# Patient Record
Sex: Female | Born: 1955 | Race: White | Hispanic: No | Marital: Married | Smoking: Current every day smoker
Health system: Southern US, Community
[De-identification: ages and names within clinical notes are randomized; demographics above are authoritative.]

## PROBLEM LIST (undated history)

## (undated) DIAGNOSIS — B379 Candidiasis, unspecified: Secondary | ICD-10-CM

## (undated) DIAGNOSIS — N83209 Unspecified ovarian cyst, unspecified side: Secondary | ICD-10-CM

## (undated) DIAGNOSIS — D649 Anemia, unspecified: Secondary | ICD-10-CM

## (undated) DIAGNOSIS — E079 Disorder of thyroid, unspecified: Secondary | ICD-10-CM

## (undated) HISTORY — PX: TONSILLECTOMY: SUR1361

## (undated) HISTORY — DX: Unspecified ovarian cyst, unspecified side: N83.209

## (undated) HISTORY — PX: WISDOM TOOTH EXTRACTION: SHX21

## (undated) HISTORY — DX: Candidiasis, unspecified: B37.9

## (undated) HISTORY — PX: ABDOMINAL HYSTERECTOMY: SUR658

## (undated) HISTORY — PX: BREAST EXCISIONAL BIOPSY: SUR124

## (undated) HISTORY — DX: Anemia, unspecified: D64.9

## (undated) HISTORY — PX: LAPAROSCOPY: SHX197

## (undated) HISTORY — DX: Disorder of thyroid, unspecified: E07.9

## (undated) HISTORY — PX: BILATERAL OOPHORECTOMY: SHX1221

---

## 1999-01-14 ENCOUNTER — Other Ambulatory Visit: Admission: RE | Admit: 1999-01-14 | Discharge: 1999-01-14 | Payer: Self-pay | Admitting: *Deleted

## 1999-07-08 ENCOUNTER — Other Ambulatory Visit: Admission: RE | Admit: 1999-07-08 | Discharge: 1999-07-08 | Payer: Self-pay | Admitting: Obstetrics and Gynecology

## 1999-07-21 ENCOUNTER — Other Ambulatory Visit: Admission: RE | Admit: 1999-07-21 | Discharge: 1999-07-21 | Payer: Self-pay | Admitting: Obstetrics and Gynecology

## 1999-07-24 ENCOUNTER — Encounter: Payer: Self-pay | Admitting: Obstetrics and Gynecology

## 1999-07-30 ENCOUNTER — Inpatient Hospital Stay (HOSPITAL_COMMUNITY): Admission: RE | Admit: 1999-07-30 | Discharge: 1999-08-03 | Payer: Self-pay | Admitting: Obstetrics and Gynecology

## 2000-09-21 ENCOUNTER — Other Ambulatory Visit: Admission: RE | Admit: 2000-09-21 | Discharge: 2000-09-21 | Payer: Self-pay | Admitting: Obstetrics and Gynecology

## 2001-10-19 ENCOUNTER — Other Ambulatory Visit: Admission: RE | Admit: 2001-10-19 | Discharge: 2001-10-19 | Payer: Self-pay | Admitting: Obstetrics and Gynecology

## 2003-01-03 ENCOUNTER — Other Ambulatory Visit: Admission: RE | Admit: 2003-01-03 | Discharge: 2003-01-03 | Payer: Self-pay | Admitting: Obstetrics and Gynecology

## 2004-04-21 ENCOUNTER — Emergency Department (HOSPITAL_COMMUNITY): Admission: EM | Admit: 2004-04-21 | Discharge: 2004-04-21 | Payer: Self-pay | Admitting: Emergency Medicine

## 2004-06-04 ENCOUNTER — Other Ambulatory Visit: Admission: RE | Admit: 2004-06-04 | Discharge: 2004-06-04 | Payer: Self-pay | Admitting: Obstetrics and Gynecology

## 2005-06-09 ENCOUNTER — Other Ambulatory Visit: Admission: RE | Admit: 2005-06-09 | Discharge: 2005-06-09 | Payer: Self-pay | Admitting: Obstetrics and Gynecology

## 2012-02-03 ENCOUNTER — Encounter: Payer: Self-pay | Admitting: Obstetrics and Gynecology

## 2012-02-03 ENCOUNTER — Ambulatory Visit (INDEPENDENT_AMBULATORY_CARE_PROVIDER_SITE_OTHER): Payer: Commercial Indemnity

## 2012-02-03 ENCOUNTER — Ambulatory Visit (INDEPENDENT_AMBULATORY_CARE_PROVIDER_SITE_OTHER): Payer: Commercial Indemnity | Admitting: Obstetrics and Gynecology

## 2012-02-03 VITALS — BP 110/70 | HR 78 | Resp 16 | Ht 66.5 in | Wt 184.0 lb

## 2012-02-03 DIAGNOSIS — E785 Hyperlipidemia, unspecified: Secondary | ICD-10-CM | POA: Insufficient documentation

## 2012-02-03 DIAGNOSIS — Z780 Asymptomatic menopausal state: Secondary | ICD-10-CM

## 2012-02-03 DIAGNOSIS — Z139 Encounter for screening, unspecified: Secondary | ICD-10-CM

## 2012-02-03 DIAGNOSIS — E079 Disorder of thyroid, unspecified: Secondary | ICD-10-CM | POA: Insufficient documentation

## 2012-02-03 DIAGNOSIS — Z01419 Encounter for gynecological examination (general) (routine) without abnormal findings: Secondary | ICD-10-CM

## 2012-02-03 DIAGNOSIS — N83209 Unspecified ovarian cyst, unspecified side: Secondary | ICD-10-CM | POA: Insufficient documentation

## 2012-02-03 NOTE — Progress Notes (Signed)
The patient is not currently taking hormone replacement therapy The patient  is not taking a Calcium supplement. Post-menopausal bleeding:no  Last Pap: was normal   2010 per pt  Last mammogram: was normal   2010 Last DEXA scan : Pt is Unsure when last dexa scan was done.  Last colonoscopy:Pt has never had colonoscopy    Urinary symptoms: none Normal bowel movements: Yes Reports abuse at home: No:   Subjective:    Kathy Allison is a 56 y.o. female G1P0 who presents for annual exam. S/p TAH / BSO and bowel resection for satge 4 endometriosis The patient has no complaints today.   The following portions of the patient's history were reviewed and updated as appropriate: allergies, current medications, past family history, past medical history, past social history, past surgical history and problem list.  Review of Systems Pertinent items are noted in HPI. Gastrointestinal:No change in bowel habits, no abdominal pain, no rectal bleeding Genitourinary:negative for dysuria, frequency, hematuria, nocturia and urinary incontinence    Objective:     BP 110/70  Pulse 78  Resp 16  Ht 5' 6.5" (1.689 m)  Wt 184 lb (83.462 kg)  BMI 29.25 kg/m2  Weight:  Wt Readings from Last 1 Encounters:  02/03/12 184 lb (83.462 kg)     BMI: Body mass index is 29.25 kg/(m^2). General Appearance: Alert, appropriate appearance for age. No acute distress HEENT: Grossly normal Neck / Thyroid: Supple, no masses, nodes or enlargement Lungs: clear to auscultation bilaterally Back: No CVA tenderness Breast Exam: No masses or nodes.No dimpling, nipple retraction or discharge. Cardiovascular: Regular rate and rhythm. S1, S2, no murmur Gastrointestinal: Soft, non-tender, no masses or organomegaly Pelvic Exam: Vulva and vagina appear normal. Bimanual exam  normal . Uterus and ovaries surgically absent Rectovaginal: normal rectal, no masses Lymphatic Exam: Non-palpable nodes in neck, clavicular, axillary, or  inguinal regions Skin: no rash or abnormalities Neurologic: Normal gait and speech, no tremor  Psychiatric: Alert and oriented, appropriate affect.     Assessment:    Normal gyn exam    Plan:   mammogram return annually or prn CRC screen with Dr. Loreta Ave Dexa scan today [facility] @ [facility] to be scheduled  Silverio Lay MD

## 2013-07-06 ENCOUNTER — Ambulatory Visit (INDEPENDENT_AMBULATORY_CARE_PROVIDER_SITE_OTHER): Payer: Managed Care, Other (non HMO) | Admitting: General Surgery

## 2013-07-06 ENCOUNTER — Encounter (INDEPENDENT_AMBULATORY_CARE_PROVIDER_SITE_OTHER): Payer: Self-pay | Admitting: General Surgery

## 2013-07-06 VITALS — BP 111/83 | HR 70 | Temp 97.6°F | Resp 16 | Ht 66.5 in | Wt 181.0 lb

## 2013-07-06 DIAGNOSIS — L03319 Cellulitis of trunk, unspecified: Secondary | ICD-10-CM

## 2013-07-06 DIAGNOSIS — L02219 Cutaneous abscess of trunk, unspecified: Secondary | ICD-10-CM

## 2013-07-06 DIAGNOSIS — L02213 Cutaneous abscess of chest wall: Secondary | ICD-10-CM

## 2013-07-06 MED ORDER — SULFAMETHOXAZOLE-TRIMETHOPRIM 400-80 MG PO TABS: 1.0000 | ORAL_TABLET | Freq: Two times a day (BID) | ORAL | Status: DC

## 2013-07-06 MED ORDER — OXYCODONE-ACETAMINOPHEN 5-325 MG PO TABS: 1.0000 | ORAL_TABLET | Freq: Four times a day (QID) | ORAL | Status: AC | PRN

## 2013-07-06 NOTE — Progress Notes (Signed)
Patient ID: Kathy Allison, female   DOB: December 17, 1955, 58 y.o.   MRN: [id number]  Chief Complaint  Patient presents with  . Wound Check    HPI Kathy Allison is a 58 y.o. female.  Pt referred by Dr. Estanislado Pandy for breast abscess.  Pt states that she had a "bug bite" that got worse and redder.  She has this incised by Dr. Estanislado Pandy and started on abx.   She states that pus has continued to come from the wound.   HPI  Past Medical History  Diagnosis Date  . Thyroid disease   . Anemia   . Yeast infection   . Ovarian cyst     Past Surgical History  Procedure Laterality Date  . Wisdom tooth extraction    . Tonsillectomy    . Laparoscopy    . Abdominal hysterectomy      2001 stage 4 endometriosis requiring bowel resection  . Bilateral oophorectomy      Family History  Problem Relation Age of Onset  . Diabetes Paternal Grandfather   . Heart disease Paternal Grandmother     had a Visual merchandiser  . Emphysema Maternal Grandfather   . Breast cancer Sister   . Diabetes Paternal Aunt     Social History History  Substance Use Topics  . Smoking status: Current Every Day Smoker -- 10.00 packs/day    Types: Cigarettes  . Smokeless tobacco: Never Used  . Alcohol Use: Yes     Comment: Wine Socially     No Known Allergies  Current Outpatient Prescriptions  Medication Sig Dispense Refill  . cephALEXin (KEFLEX) 500 MG capsule       . ezetimibe-simvastatin (VYTORIN) 10-20 MG per tablet Take 1 tablet by mouth at bedtime.      Marland Kitchen levothyroxine (SYNTHROID) 75 MCG tablet Take 75 mcg by mouth daily.      . pravastatin (PRAVACHOL) 80 MG tablet        No current facility-administered medications for this visit.    Review of Systems Review of Systems  Constitutional: Negative.   HENT: Negative.   Respiratory: Negative.   Cardiovascular: Negative.   Gastrointestinal: Negative.   Neurological: Negative.   All other systems reviewed and are negative.   Blood pressure 111/83, pulse 70,  temperature 97.6 F (36.4 C), temperature source Temporal, resp. rate 16, height 5' 6.5" (1.689 m), weight 181 lb (82.101 kg).  Physical Exam Physical Exam  Constitutional: She is oriented to person, place, and time. She appears well-developed and well-nourished.  HENT:  Head: Normocephalic and atraumatic.  Eyes: Conjunctivae and EOM are normal. Pupils are equal, round, and reactive to light.  Neck: Normal range of motion. Neck supple.  Cardiovascular: Normal rate, regular rhythm and normal heart sounds.   Pulmonary/Chest: Effort normal and breath sounds normal.    Pus coming from chest wall wound.  Musculoskeletal: Normal range of motion.  Neurological: She is alert and oriented to person, place, and time.  Skin: Skin is warm and dry.  Psychiatric: She has a normal mood and affect.    Data Reviewed none  Assessment    58 y/o F with L breast abscess.     Plan    1. I&D of L breast abscess with packing 2. DC cephalexan, start bactrim 3. R/u in 2 weeks 4. BID dressing changes.        Axel Filler 07/06/2013, 3:21 PM

## 2013-07-16 ENCOUNTER — Ambulatory Visit (INDEPENDENT_AMBULATORY_CARE_PROVIDER_SITE_OTHER): Payer: Managed Care, Other (non HMO) | Admitting: General Surgery

## 2013-07-16 ENCOUNTER — Encounter (INDEPENDENT_AMBULATORY_CARE_PROVIDER_SITE_OTHER): Payer: Managed Care, Other (non HMO) | Admitting: General Surgery

## 2013-07-16 ENCOUNTER — Encounter (INDEPENDENT_AMBULATORY_CARE_PROVIDER_SITE_OTHER): Payer: Self-pay | Admitting: General Surgery

## 2013-07-16 VITALS — BP 108/70 | HR 66 | Temp 98.1°F | Resp 16 | Ht 66.5 in | Wt 180.0 lb

## 2013-07-16 DIAGNOSIS — L02213 Cutaneous abscess of chest wall: Secondary | ICD-10-CM

## 2013-07-16 DIAGNOSIS — L03319 Cellulitis of trunk, unspecified: Secondary | ICD-10-CM

## 2013-07-16 DIAGNOSIS — L02219 Cutaneous abscess of trunk, unspecified: Secondary | ICD-10-CM

## 2013-07-16 NOTE — Progress Notes (Signed)
Subjective:     Patient ID: Kathy Allison, female   DOB: 1955-06-12, 58 y.o.   MRN: [id number]  HPI Patient has been doing well since her last visit. She's continued packing her wound. She sufficient antibiotics today. She's had no further drainage.  Review of Systems  Constitutional: Negative.   HENT: Negative.   Respiratory: Negative.   Cardiovascular: Negative.   Gastrointestinal: Negative.   Neurological: Negative.   All other systems reviewed and are negative.      Objective:   Physical Exam  Constitutional: She is oriented to person, place, and time. She appears well-developed and well-nourished.  HENT:  Head: Normocephalic and atraumatic.  Eyes: Conjunctivae and EOM are normal. Pupils are equal, round, and reactive to light.  Neck: Normal range of motion. Neck supple.  Cardiovascular: Normal rate, regular rhythm and normal heart sounds.   Pulmonary/Chest: Effort normal and breath sounds normal.    Healed incision, no erythema, no drainage  Musculoskeletal: Normal range of motion.  Neurological: She is alert and oriented to person, place, and time.  Skin: Skin is warm and dry.  Psychiatric: She has a normal mood and affect.       Assessment:     58 year old female status post cellulitis and abscess status post I&D     Plan:     1. Patient can follow up as needed 2. Patient can finished her antibiotics today

## 2013-08-13 ENCOUNTER — Encounter (INDEPENDENT_AMBULATORY_CARE_PROVIDER_SITE_OTHER): Payer: Managed Care, Other (non HMO) | Admitting: General Surgery

## 2013-12-17 ENCOUNTER — Encounter (INDEPENDENT_AMBULATORY_CARE_PROVIDER_SITE_OTHER): Payer: Self-pay | Admitting: General Surgery

## 2017-10-05 ENCOUNTER — Other Ambulatory Visit: Payer: Self-pay | Admitting: Family Medicine

## 2017-10-05 DIAGNOSIS — Z720 Tobacco use: Secondary | ICD-10-CM

## 2017-10-12 ENCOUNTER — Ambulatory Visit: Payer: Self-pay

## 2017-10-25 ENCOUNTER — Ambulatory Visit
Admission: RE | Admit: 2017-10-25 | Discharge: 2017-10-25 | Disposition: A | Discharge status: Home / Self Care | Payer: BLUE CROSS/BLUE SHIELD | Source: Ambulatory Visit | Attending: Family Medicine | Admitting: Family Medicine

## 2017-10-25 DIAGNOSIS — Z720 Tobacco use: Secondary | ICD-10-CM

## 2019-11-29 ENCOUNTER — Other Ambulatory Visit: Payer: Self-pay | Admitting: Family Medicine

## 2019-11-29 DIAGNOSIS — Z720 Tobacco use: Secondary | ICD-10-CM

## 2019-12-05 ENCOUNTER — Ambulatory Visit: Payer: BLUE CROSS/BLUE SHIELD

## 2019-12-20 ENCOUNTER — Ambulatory Visit
Admission: RE | Admit: 2019-12-20 | Discharge: 2019-12-20 | Disposition: A | Discharge status: Home / Self Care | Payer: BLUE CROSS/BLUE SHIELD | Source: Ambulatory Visit | Attending: Family Medicine | Admitting: Family Medicine

## 2019-12-20 DIAGNOSIS — Z720 Tobacco use: Secondary | ICD-10-CM

## 2020-10-24 ENCOUNTER — Other Ambulatory Visit: Payer: Self-pay | Admitting: Family Medicine

## 2020-10-24 DIAGNOSIS — R911 Solitary pulmonary nodule: Secondary | ICD-10-CM

## 2020-10-25 ENCOUNTER — Inpatient Hospital Stay: Admission: RE | Admit: 2020-10-25 | Payer: BC Managed Care – PPO | Source: Ambulatory Visit

## 2020-10-29 ENCOUNTER — Other Ambulatory Visit: Payer: Self-pay | Admitting: Family Medicine

## 2020-10-29 DIAGNOSIS — R911 Solitary pulmonary nodule: Secondary | ICD-10-CM

## 2020-10-29 DIAGNOSIS — Z720 Tobacco use: Secondary | ICD-10-CM

## 2020-11-14 ENCOUNTER — Ambulatory Visit: Payer: BC Managed Care – PPO

## 2020-11-19 ENCOUNTER — Ambulatory Visit
Admission: RE | Admit: 2020-11-19 | Discharge: 2020-11-19 | Disposition: A | Discharge status: Home / Self Care | Payer: BC Managed Care – PPO | Source: Ambulatory Visit | Attending: Family Medicine | Admitting: Family Medicine

## 2020-11-19 DIAGNOSIS — Z720 Tobacco use: Secondary | ICD-10-CM

## 2020-11-19 DIAGNOSIS — R911 Solitary pulmonary nodule: Secondary | ICD-10-CM

## 2021-11-12 ENCOUNTER — Other Ambulatory Visit: Payer: Self-pay | Admitting: Family Medicine

## 2021-11-12 DIAGNOSIS — R911 Solitary pulmonary nodule: Secondary | ICD-10-CM

## 2021-12-09 ENCOUNTER — Ambulatory Visit
Admission: RE | Admit: 2021-12-09 | Discharge: 2021-12-09 | Disposition: A | Discharge status: Home / Self Care | Payer: BC Managed Care – PPO | Source: Ambulatory Visit | Attending: Family Medicine | Admitting: Family Medicine

## 2021-12-09 DIAGNOSIS — R911 Solitary pulmonary nodule: Secondary | ICD-10-CM

## 2022-03-17 ENCOUNTER — Other Ambulatory Visit: Payer: Self-pay | Admitting: Obstetrics and Gynecology

## 2022-03-17 DIAGNOSIS — Z13820 Encounter for screening for osteoporosis: Secondary | ICD-10-CM

## 2022-03-18 ENCOUNTER — Other Ambulatory Visit: Payer: Self-pay | Admitting: Obstetrics and Gynecology

## 2022-03-18 DIAGNOSIS — R928 Other abnormal and inconclusive findings on diagnostic imaging of breast: Secondary | ICD-10-CM

## 2022-03-29 ENCOUNTER — Ambulatory Visit
Admission: RE | Admit: 2022-03-29 | Discharge: 2022-03-29 | Disposition: A | Discharge status: Home / Self Care | Payer: BC Managed Care – PPO | Source: Ambulatory Visit | Attending: Obstetrics and Gynecology | Admitting: Obstetrics and Gynecology

## 2022-03-29 ENCOUNTER — Other Ambulatory Visit: Payer: Self-pay | Admitting: Obstetrics and Gynecology

## 2022-03-29 DIAGNOSIS — R928 Other abnormal and inconclusive findings on diagnostic imaging of breast: Secondary | ICD-10-CM

## 2022-03-29 DIAGNOSIS — N6489 Other specified disorders of breast: Secondary | ICD-10-CM

## 2022-04-09 ENCOUNTER — Ambulatory Visit
Admission: RE | Admit: 2022-04-09 | Discharge: 2022-04-09 | Disposition: A | Discharge status: Home / Self Care | Payer: BC Managed Care – PPO | Source: Ambulatory Visit | Attending: Obstetrics and Gynecology | Admitting: Obstetrics and Gynecology

## 2022-04-09 DIAGNOSIS — R928 Other abnormal and inconclusive findings on diagnostic imaging of breast: Secondary | ICD-10-CM

## 2022-04-09 DIAGNOSIS — N6489 Other specified disorders of breast: Secondary | ICD-10-CM

## 2022-04-09 HISTORY — PX: BREAST BIOPSY: SHX20

## 2022-04-14 ENCOUNTER — Ambulatory Visit
Admission: RE | Admit: 2022-04-14 | Discharge: 2022-04-14 | Disposition: A | Discharge status: Home / Self Care | Payer: BC Managed Care – PPO | Source: Ambulatory Visit | Attending: Obstetrics and Gynecology | Admitting: Obstetrics and Gynecology

## 2022-04-14 DIAGNOSIS — Z13820 Encounter for screening for osteoporosis: Secondary | ICD-10-CM

## 2022-08-18 IMAGING — CT CT CHEST LUNG CANCER SCREENING LOW DOSE W/O CM
2 of 5 series · 15 of 40 positions shown, 18 images · non-contrast
Comparison: CT chest lung cancer screening dated December 20, 2019
COMPARISON: CT chest lung cancer screening dated December 20, 2019

Addendum:
CLINICAL DATA: Current smoker with 53 pack-year history

EXAM:
CT CHEST WITHOUT CONTRAST LOW-DOSE FOR LUNG CANCER SCREENING
TECHNIQUE: Multidetector CT imaging of the chest was performed following the
standard protocol without IV contrast.

[Series 4: lung 1.00 br44 cor · coronal · 0.60mm/px · 3 of 306 slices shown]
[im 62/306  lung]
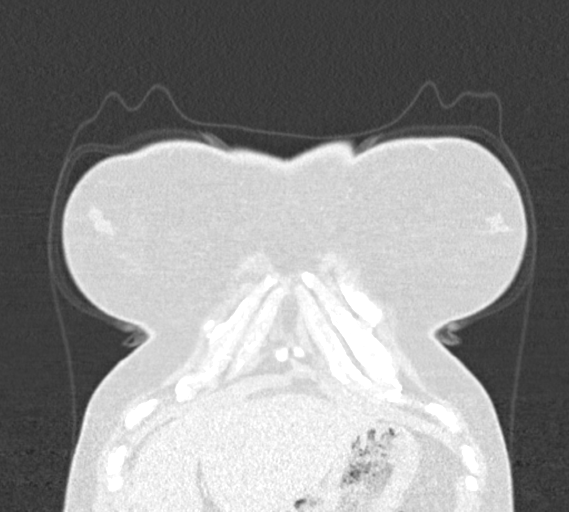
[im 123/306  lung]
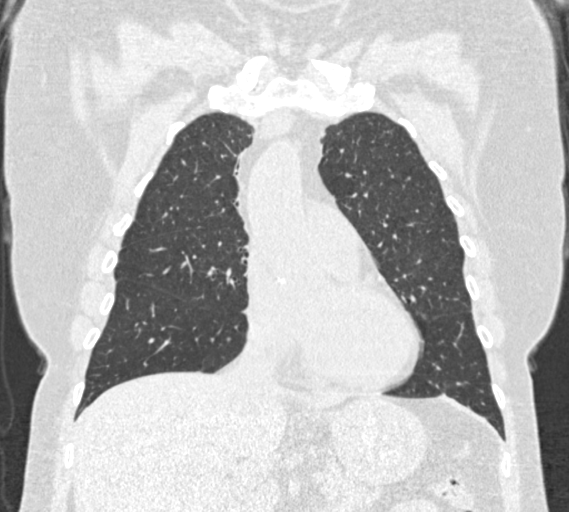
[im 184/306  lung]
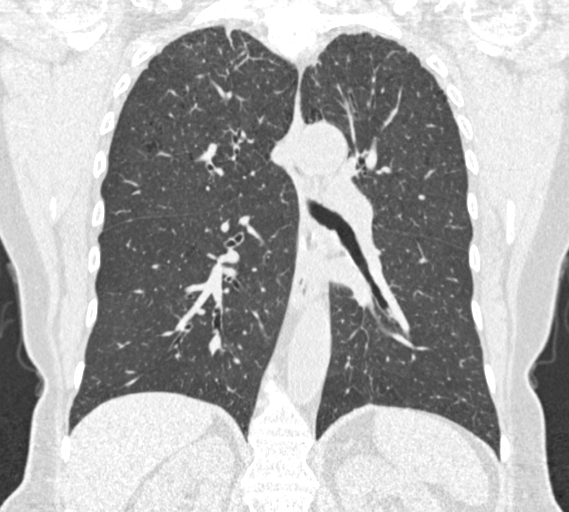

[Series 9: lung 1.00 br60 axial · axial · 0.67mm/px · z∈[-1267,-987]mm · 12 of 308 slices shown, 15 images]
[im 14/308  mediastinal]
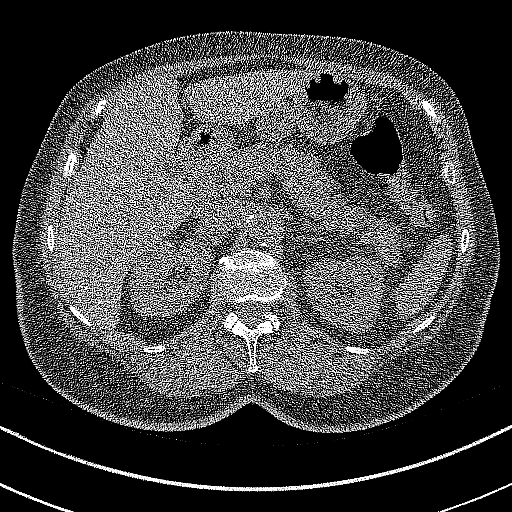
[im 14/308  lung]
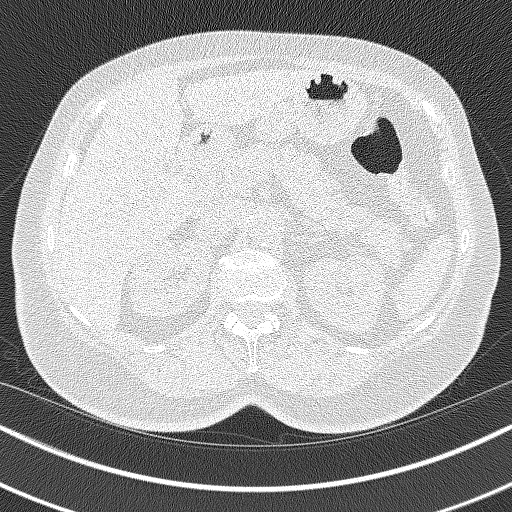
[im 42/308  lung]
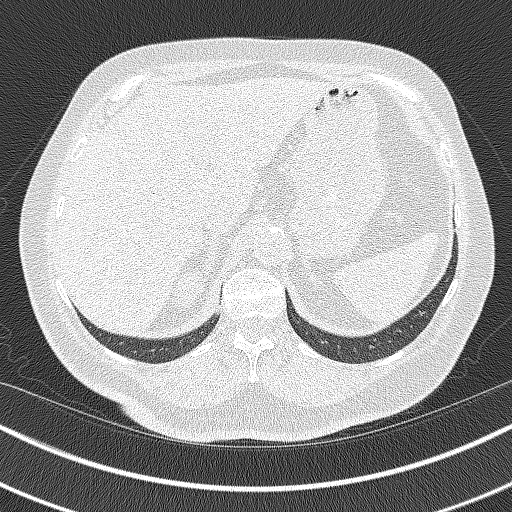
[im 70/308  lung]
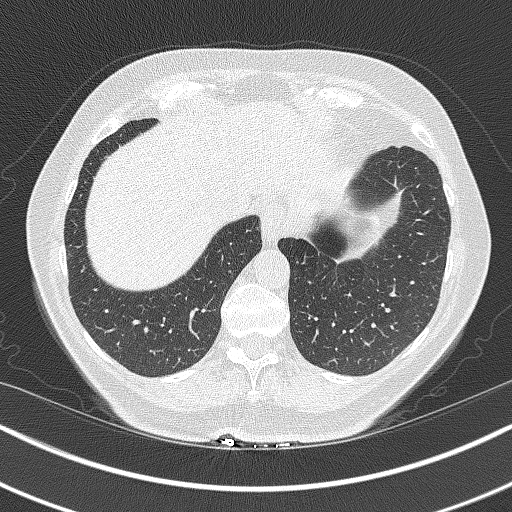
[im 98/308  lung]
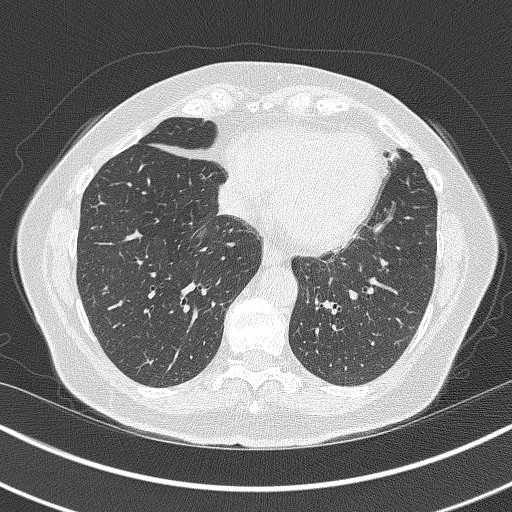
[im 112/308  mediastinal]
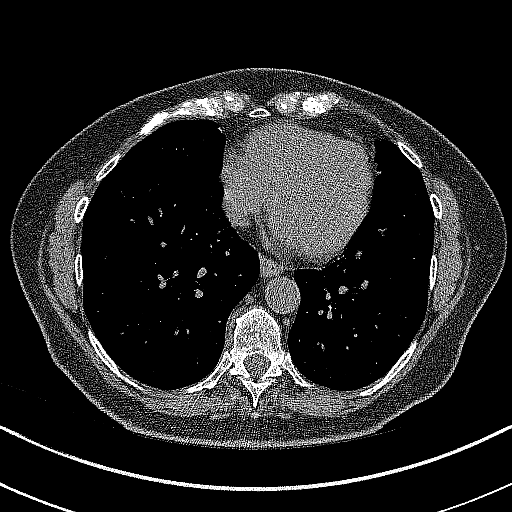
[im 112/308  lung]
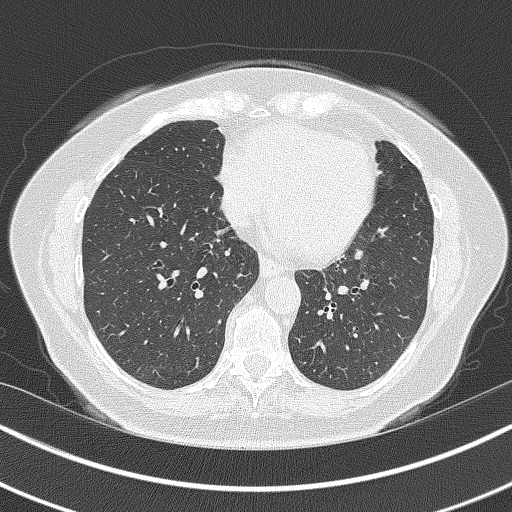
[im 140/308  lung]
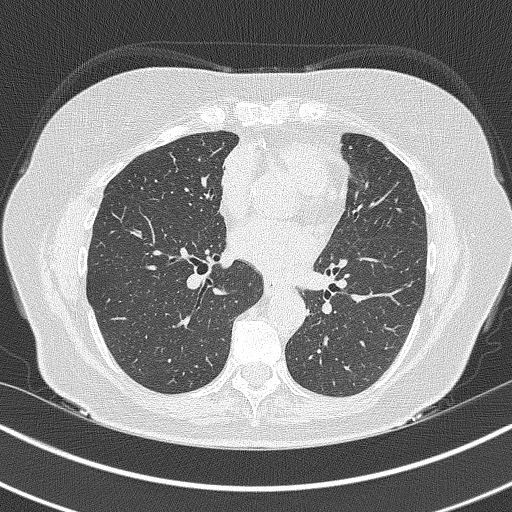
[im 168/308  lung]
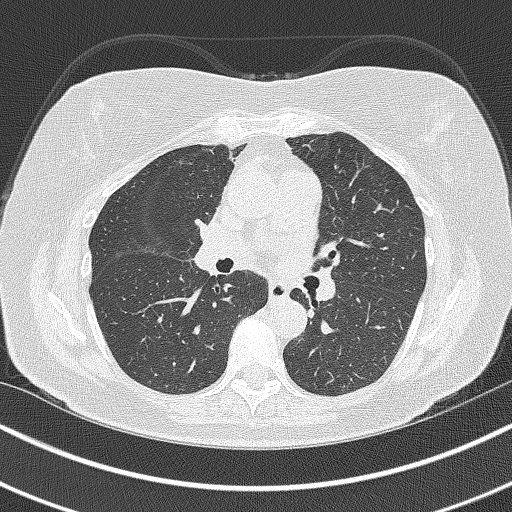
[im 196/308  lung]
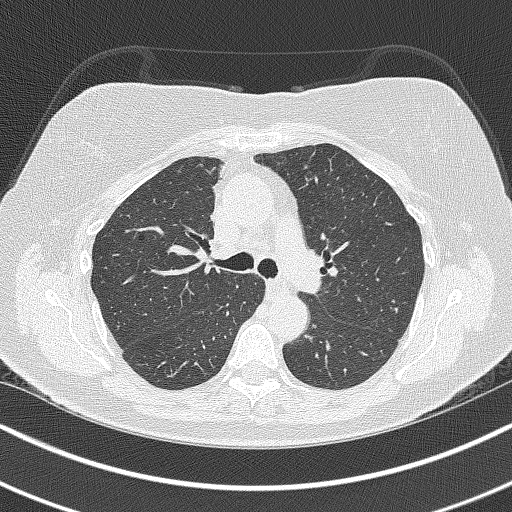
[im 210/308  mediastinal]
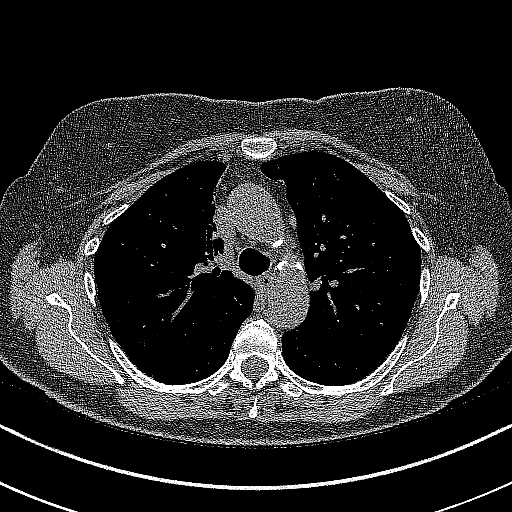
[im 210/308  lung]
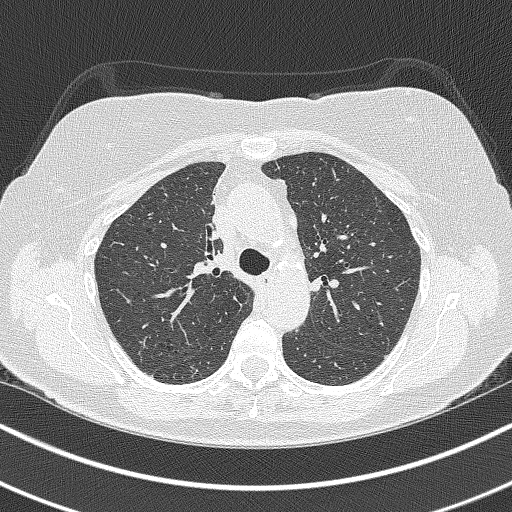
[im 238/308  lung]
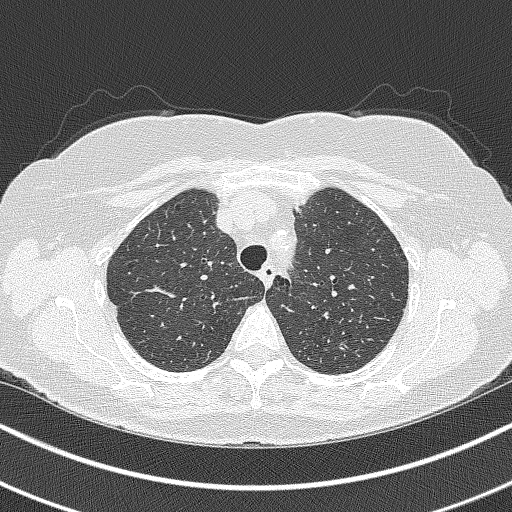
[im 266/308  lung]
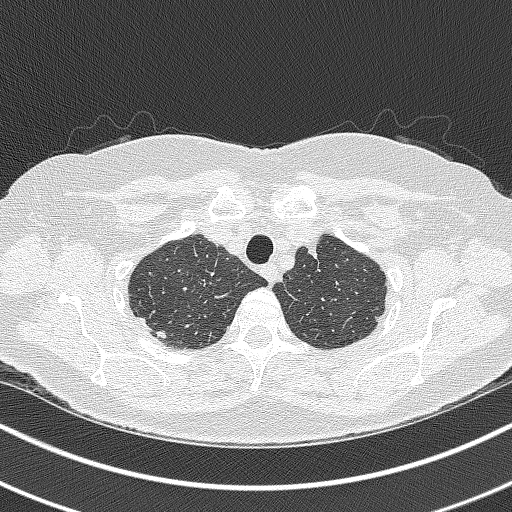
[im 294/308  lung]
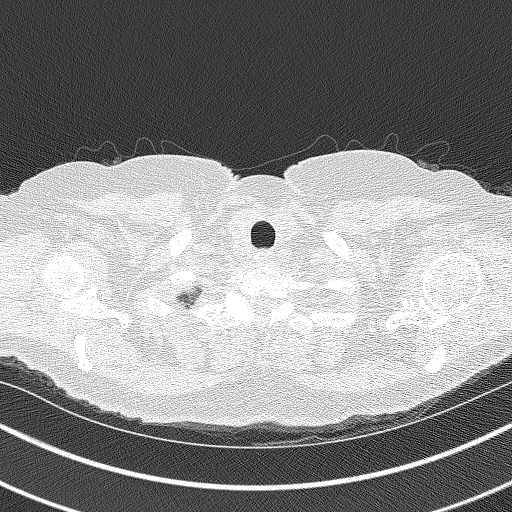

[15 of 40 positions shown; findings below may reference images not displayed]

FINDINGS: Cardiovascular: Normal heart size. Three-vessel coronary artery
calcifications. Atherosclerotic disease of the thoracic aorta. No
pericardial effusion.

Mediastinum/Nodes: Esophagus and thyroid are unremarkable. No
pathologically enlarged lymph nodes seen in the chest.

Lungs/Pleura: Central airways are patent. Focal bronchiectasis and
linear opacities of the paramediastinal right middle and upper lobe,
unchanged compared to prior and likely sequela of prior infection.
Centrilobular emphysema. No consolidation, pleural effusion or
pneumothorax. New solid pulmonary nodule measuring

Upper Abdomen: No acute abnormality.

Musculoskeletal: No chest wall mass or suspicious bone lesions
identified.
IMPRESSION: Lung-RADS 2, benign appearance or behavior. Continue annual
screening with low-dose chest CT without contrast in 12 months.

Aortic Atherosclerosis (0X4G2-7UA.A) and Emphysema (0X4G2-255.K).

ADDENDUM:
Corrected report: Original report was generated with an error.
Included chest summary report is correct and in findings section
"new solid pulmonary nodule measuring" should be deleted and instead
should state "stable solid pulmonary nodules."

*** End of Addendum ***
FINDINGS: Cardiovascular: Normal heart size. Three-vessel coronary artery
calcifications. Atherosclerotic disease of the thoracic aorta. No
pericardial effusion.

Mediastinum/Nodes: Esophagus and thyroid are unremarkable. No
pathologically enlarged lymph nodes seen in the chest.

Lungs/Pleura: Central airways are patent. Focal bronchiectasis and
linear opacities of the paramediastinal right middle and upper lobe,
unchanged compared to prior and likely sequela of prior infection.
Centrilobular emphysema. No consolidation, pleural effusion or
pneumothorax. New solid pulmonary nodule measuring

Upper Abdomen: No acute abnormality.

Musculoskeletal: No chest wall mass or suspicious bone lesions
identified.
IMPRESSION: Lung-RADS 2, benign appearance or behavior. Continue annual
screening with low-dose chest CT without contrast in 12 months.

Aortic Atherosclerosis (0X4G2-7UA.A) and Emphysema (0X4G2-255.K).

## 2023-04-19 ENCOUNTER — Other Ambulatory Visit: Payer: Self-pay | Admitting: Obstetrics and Gynecology

## 2023-04-19 DIAGNOSIS — Z1231 Encounter for screening mammogram for malignant neoplasm of breast: Secondary | ICD-10-CM

## 2023-05-06 ENCOUNTER — Other Ambulatory Visit: Payer: Self-pay | Admitting: Obstetrics and Gynecology

## 2023-05-06 DIAGNOSIS — N6489 Other specified disorders of breast: Secondary | ICD-10-CM

## 2023-06-01 ENCOUNTER — Ambulatory Visit: Admission: RE | Admit: 2023-06-01 | Source: Ambulatory Visit

## 2023-06-01 ENCOUNTER — Ambulatory Visit
Admission: RE | Admit: 2023-06-01 | Discharge: 2023-06-01 | Disposition: A | Discharge status: Home / Self Care | Source: Ambulatory Visit | Attending: Obstetrics and Gynecology | Admitting: Obstetrics and Gynecology

## 2023-06-01 DIAGNOSIS — N6489 Other specified disorders of breast: Secondary | ICD-10-CM
